# Patient Record
Sex: Male | Born: 2000 | Race: White | Hispanic: No | Marital: Single | Smoking: Never smoker
Health system: Southern US, Community
[De-identification: ages and names within clinical notes are randomized; demographics above are authoritative.]

---
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
## 2020-02-02 ENCOUNTER — Emergency Department
Admission: EM | Admit: 2020-02-02 | Discharge: 2020-02-02 | Disposition: A | Discharge status: Home / Self Care | Payer: BC Managed Care – PPO | Attending: Emergency Medicine | Admitting: Emergency Medicine

## 2020-02-02 ENCOUNTER — Other Ambulatory Visit: Payer: Self-pay

## 2020-02-02 ENCOUNTER — Encounter: Payer: Self-pay | Admitting: Emergency Medicine

## 2020-02-02 ENCOUNTER — Emergency Department: Payer: BC Managed Care – PPO

## 2020-02-02 DIAGNOSIS — R112 Nausea with vomiting, unspecified: Secondary | ICD-10-CM | POA: Diagnosis not present

## 2020-02-02 DIAGNOSIS — K859 Acute pancreatitis without necrosis or infection, unspecified: Secondary | ICD-10-CM | POA: Diagnosis not present

## 2020-02-02 DIAGNOSIS — R1013 Epigastric pain: Secondary | ICD-10-CM | POA: Diagnosis present

## 2020-02-02 LAB — COMPREHENSIVE METABOLIC PANEL
ALT: 38 U/L (ref 0–44)
AST: 24 U/L (ref 15–41)
Albumin: 4.9 g/dL (ref 3.5–5.0)
Alkaline Phosphatase: 98 U/L (ref 38–126)
Anion gap: 11 (ref 5–15)
BUN: 17 mg/dL (ref 6–20)
CO2: 25 mmol/L (ref 22–32)
Calcium: 9.8 mg/dL (ref 8.9–10.3)
Chloride: 101 mmol/L (ref 98–111)
Creatinine, Ser: 0.9 mg/dL (ref 0.61–1.24)
GFR calc Af Amer: >60 mL/min (ref >60)
GFR calc non Af Amer: >60 mL/min (ref >60)
Glucose, Bld: 137 mg/dL — ABNORMAL HIGH (ref 70–99)
Potassium: 4 mmol/L (ref 3.5–5.1)
Sodium: 137 mmol/L (ref 135–145)
Total Bilirubin: 1.5 mg/dL — ABNORMAL HIGH (ref 0.3–1.2)
Total Protein: 8.3 g/dL — ABNORMAL HIGH (ref 6.5–8.1)

## 2020-02-02 LAB — CBC
HCT: 45.3 % (ref 39.0–52.0)
Hemoglobin: 15.3 g/dL (ref 13.0–17.0)
MCH: 27.8 pg (ref 26.0–34.0)
MCHC: 33.8 g/dL (ref 30.0–36.0)
MCV: 82.4 fL (ref 80.0–100.0)
Platelets: 317 10*3/uL (ref 150–400)
RBC: 5.5 MIL/uL (ref 4.22–5.81)
RDW: 12.9 % (ref 11.5–15.5)
WBC: 14.7 10*3/uL — ABNORMAL HIGH (ref 4.0–10.5)
nRBC: 0 % (ref 0.0–0.2)

## 2020-02-02 LAB — URINALYSIS, COMPLETE (UACMP) WITH MICROSCOPIC
Bacteria, UA: NONE SEEN
Bilirubin Urine: NEGATIVE
Glucose, UA: 50 mg/dL — AB
Hgb urine dipstick: NEGATIVE
Ketones, ur: NEGATIVE mg/dL
Leukocytes,Ua: NEGATIVE
Nitrite: NEGATIVE
Protein, ur: 100 mg/dL — AB
Specific Gravity, Urine: 1.036 — ABNORMAL HIGH (ref 1.005–1.030)
pH: 6 (ref 5.0–8.0)

## 2020-02-02 LAB — LIPASE, BLOOD: Lipase: 137 U/L — ABNORMAL HIGH (ref 11–51)

## 2020-02-02 MED ORDER — ONDANSETRON HCL 4 MG/2ML IJ SOLN
4.0000 mg | Freq: Once | INTRAMUSCULAR | Status: AC
  Administered 2020-02-02: 14:00:00 4 mg via INTRAVENOUS
  Filled 2020-02-02: qty 2

## 2020-02-02 MED ORDER — IOHEXOL 300 MG/ML  SOLN
100.0000 mL | Freq: Once | INTRAMUSCULAR | Status: AC | PRN
  Administered 2020-02-02: 15:00:00 100 mL via INTRAVENOUS

## 2020-02-02 MED ORDER — MORPHINE SULFATE (PF) 2 MG/ML IV SOLN
2.0000 mg | Freq: Once | INTRAVENOUS | Status: AC
  Administered 2020-02-02: 14:00:00 2 mg via INTRAVENOUS
  Filled 2020-02-02: qty 1

## 2020-02-02 MED ORDER — ONDANSETRON 4 MG PO TBDP: 4.0000 mg | ORAL_TABLET | Freq: Three times a day (TID) | ORAL | 0 refills | Status: AC | PRN

## 2020-02-02 MED ORDER — SODIUM CHLORIDE 0.9 % IV SOLN
1000.0000 mL | Freq: Once | INTRAVENOUS | Status: AC
  Administered 2020-02-02: 14:00:00 1000 mL via INTRAVENOUS

## 2020-02-02 MED ORDER — HYDROCODONE-ACETAMINOPHEN 5-325 MG PO TABS: 1.0000 | ORAL_TABLET | Freq: Four times a day (QID) | ORAL | 0 refills | Status: AC | PRN

## 2020-02-02 NOTE — ED Provider Notes (Signed)
[facility] Emergency Department Provider Note   ____________________________________________    I have reviewed the triage vital signs and the nursing notes.   HISTORY  Chief Complaint Abdominal Pain     HPI Eric Reyes is a 19 y.o. male who presents with complaints of abdominal pain. Patient reports 2 to 3 days of epigastric abdominal pain, worse with any eating. He reports nausea and vomiting, not tolerating p.o.'s. Normal stools. Never had this before. No history of abdominal surgery. No history of GERD. Has not take anything for this. No fevers or chills. Does not radiate.  History reviewed. No pertinent past medical history.  There are no problems to display for this patient.   History reviewed. No pertinent surgical history.  Prior to Admission medications   Medication Sig Start Date End Date Taking? Authorizing Provider  amoxicillin-clavulanate (AUGMENTIN) 875-125 MG tablet Take 1 tablet by mouth every 12 (twelve) hours. 05/28/16   Renford Dills, NP  HYDROcodone-acetaminophen (NORCO/VICODIN) 5-325 MG tablet Take 1 tablet by mouth every 6 (six) hours as needed for severe pain. 02/02/20   Jene Every, MD  mupirocin ointment (BACTROBAN) 2 % Apply three times a day for 5 days. 05/28/16   Renford Dills, NP  ondansetron (ZOFRAN ODT) 4 MG disintegrating tablet Take 1 tablet (4 mg total) by mouth every 8 (eight) hours as needed. 02/02/20   Jene Every, MD     Allergies Patient has no known allergies.  History reviewed. No pertinent family history.  Social History Social History   Tobacco Use  . Smoking status: Never Smoker  . Smokeless tobacco: Never Used  Substance Use Topics  . Alcohol use: No  . Drug use: Not on file    Review of Systems  Constitutional: No fever/chills Eyes: No visual changes.  ENT: No sore throat. Cardiovascular: Denies chest pain. Respiratory: Denies shortness of breath. Gastrointestinal: As  above Genitourinary: Negative for dysuria. Musculoskeletal: Negative for back pain. Skin: Negative for rash. Neurological: Negative for headaches    ____________________________________________   PHYSICAL EXAM:  VITAL SIGNS: ED Triage Vitals  Enc Vitals Group     BP 02/02/20 1152 123/88     Pulse Rate 02/02/20 1152 82     Resp 02/02/20 1152 16     Temp 02/02/20 1152 99.4 F (37.4 C)     Temp Source 02/02/20 1152 Oral     SpO2 02/02/20 1152 97 %     Weight 02/02/20 1150 71.7 kg (158 lb)     Height 02/02/20 1150 1.702 m (5\' 7" )     Head Circumference --      Peak Flow --      Pain Score 02/02/20 1149 7     Pain Loc --      Pain Edu? --      Excl. in GC? --     Constitutional: Alert and oriented.   Nose: No congestion/rhinnorhea. Mouth/Throat: Mucous membranes are moist.   Neck:  Painless ROM Cardiovascular: Normal rate, regular rhythm. Grossly normal heart sounds.  Good peripheral circulation. Respiratory: Normal respiratory effort.  No retractions. Lungs CTAB. Gastrointestinal: Mild tenderness to the epigastrium . No distention.  No CVA tenderness.  Musculoskeletal:   Warm and well perfused Neurologic:  Normal speech and language. No gross focal neurologic deficits are appreciated.  Skin:  Skin is warm, dry and intact. No rash noted. Psychiatric: Mood and affect are normal. Speech and behavior are normal.  ____________________________________________   LABS (all labs ordered are  listed, but only abnormal results are displayed)  Labs Reviewed  LIPASE, BLOOD - Abnormal; Notable for the following components:      Result Value   Lipase 137 (*)    All other components within normal limits  COMPREHENSIVE METABOLIC PANEL - Abnormal; Notable for the following components:   Glucose, Bld 137 (*)    Total Protein 8.3 (*)    Total Bilirubin 1.5 (*)    All other components within normal limits  CBC - Abnormal; Notable for the following components:   WBC 14.7 (*)     All other components within normal limits  URINALYSIS, COMPLETE (UACMP) WITH MICROSCOPIC - Abnormal; Notable for the following components:   Color, Urine AMBER (*)    APPearance HAZY (*)    Specific Gravity, Urine 1.036 (*)    Glucose, UA 50 (*)    Protein, ur 100 (*)    All other components within normal limits   ____________________________________________  EKG  ED ECG REPORT I, Lavonia Drafts, the attending physician, personally viewed and interpreted this ECG.  Date: 02/02/2020  Rhythm: normal sinus rhythm QRS Axis: normal Intervals: normal ST/T Wave abnormalities: normal Narrative Interpretation: no evidence of acute ischemia  ____________________________________________  RADIOLOGY  CT abdomen pelvis consistent with acute pancreatitis, uncomplicated ____________________________________________   PROCEDURES  Procedure(s) performed: No  Procedures   Critical Care performed: No ____________________________________________   INITIAL IMPRESSION / ASSESSMENT AND PLAN / ED COURSE  Pertinent labs & imaging results that were available during my care of the patient were reviewed by me and considered in my medical decision making (see chart for details).  Patient presents with nausea vomiting epigastric pain.  Differential includes cannabis hyperemesis, gastritis, pancreatitis.  Lab work significant for mildly elevated lipase.  Will send for CT abdomen pelvis.  Treated with IV morphine, IV Zofran  Patient with significant provement after IV morphine and IV Zofran, CT consistent with acute pancreatitis however appears uncomplicated.  He is well-appearing and feels improved.  Appropriate for outpatient management with analgesics, Zofran, liquid diet, progress Gradually.  Return precautions discussed    ____________________________________________   FINAL CLINICAL IMPRESSION(S) / ED DIAGNOSES  Final diagnoses:  Acute pancreatitis, unspecified complication  status, unspecified pancreatitis type        Note:  This document was prepared using Dragon voice recognition software and may include unintentional dictation errors.   Lavonia Drafts, MD 02/02/20 [phone]

## 2020-02-02 NOTE — ED Triage Notes (Signed)
Pt here for vomiting and upper mid abdominal pain. Last vomiting last night.  C/o tight feeling in upper abdomen.  Reports subjective fevers.  No diarrhea.  Ambulatory.  VSS in triage.

## 2020-06-26 IMAGING — CT CT ABD-PELV W/ CM
2 of 4 series · 16 of 46 positions shown, 18 images · IV contrast (APPLIED)
Comparison: None.

CLINICAL DATA: Pancreatitis is effected. Vomiting and upper
abdominal pain.

EXAM:
CT ABDOMEN AND PELVIS WITH CONTRAST
TECHNIQUE: Multidetector CT imaging of the abdomen and pelvis was performed
using the standard protocol following bolus administration of
intravenous contrast.
CONTRAST:  100mL OMNIPAQUE IOHEXOL 300 MG/ML  SOLN

[Series 2: routine abd/pel with · axial · 0.70mm/px · z∈[-485,-55]mm · 13 of 94 slices shown, 15 images]
[im 4/94  soft-tissue]
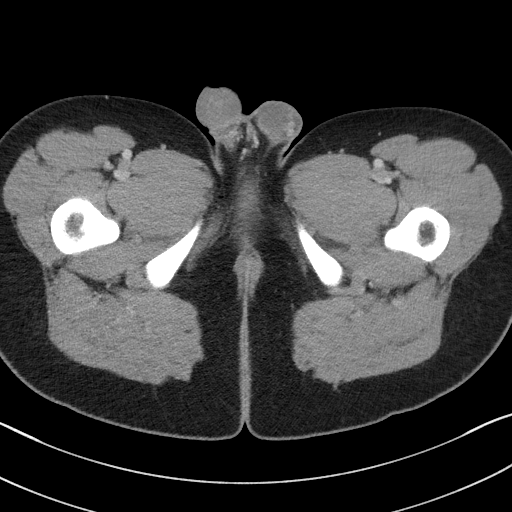
[im 4/94  bone]
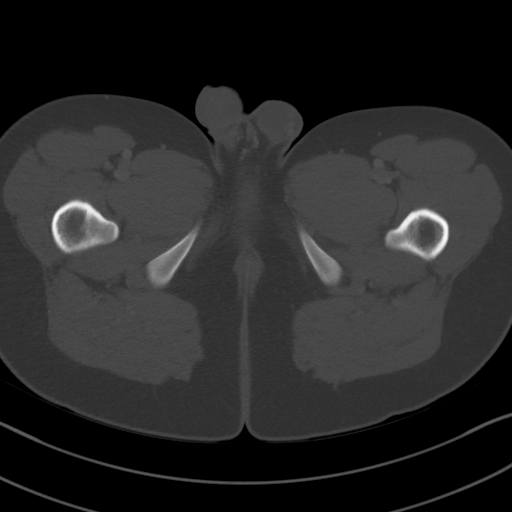
[im 12/94  soft-tissue]
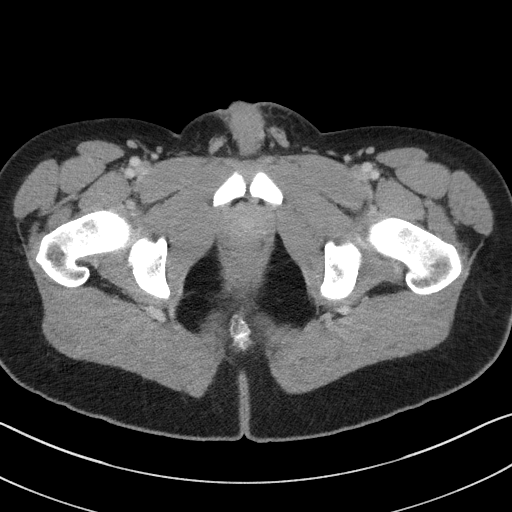
[im 20/94  soft-tissue]
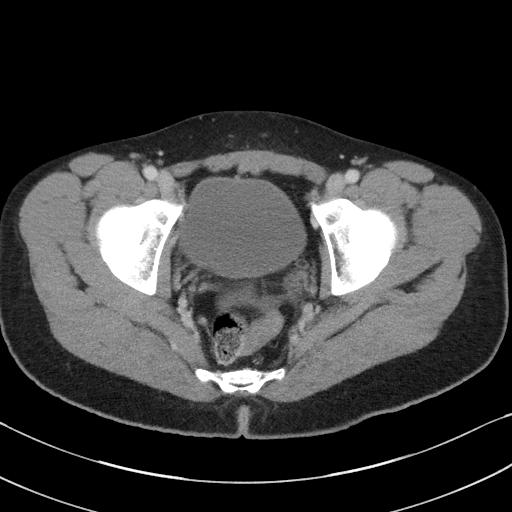
[im 28/94  soft-tissue]
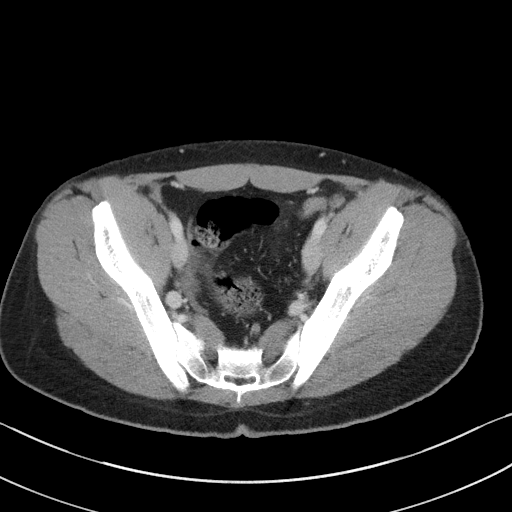
[im 32/94  soft-tissue]
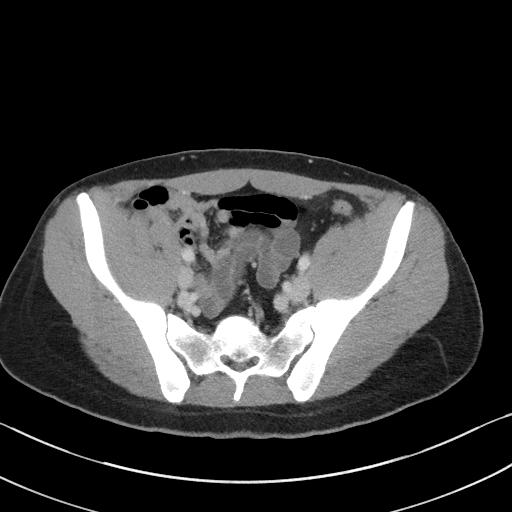
[im 39/94  soft-tissue]
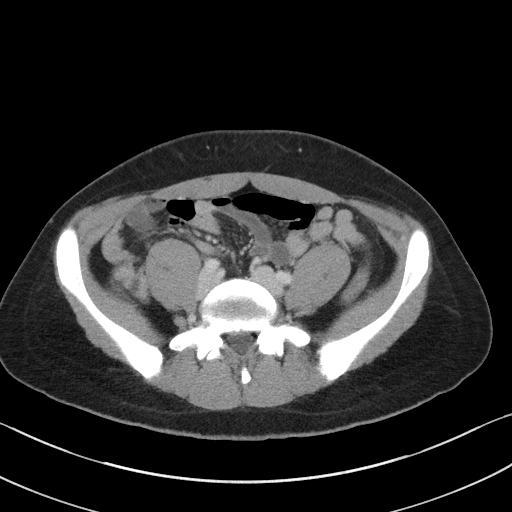
[im 47/94  soft-tissue]
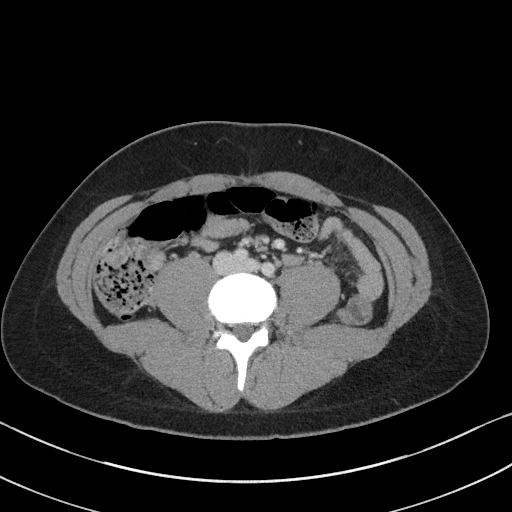
[im 55/94  soft-tissue]
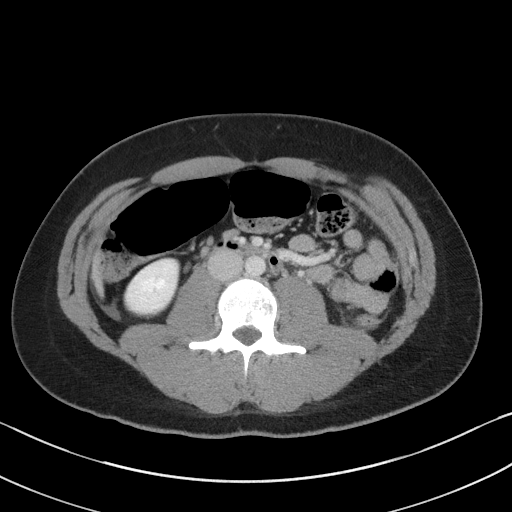
[im 63/94  soft-tissue]
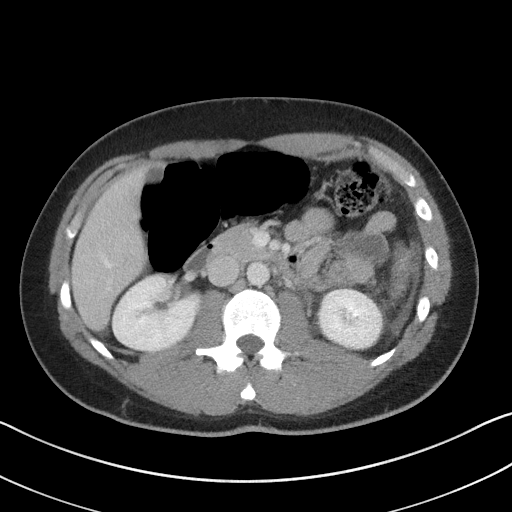
[im 63/94  bone]
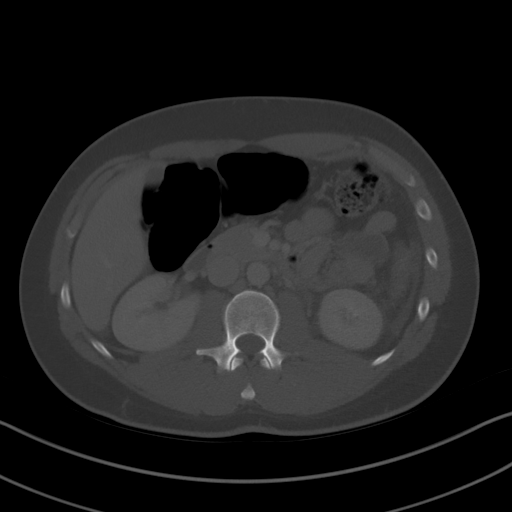
[im 66/94  soft-tissue]
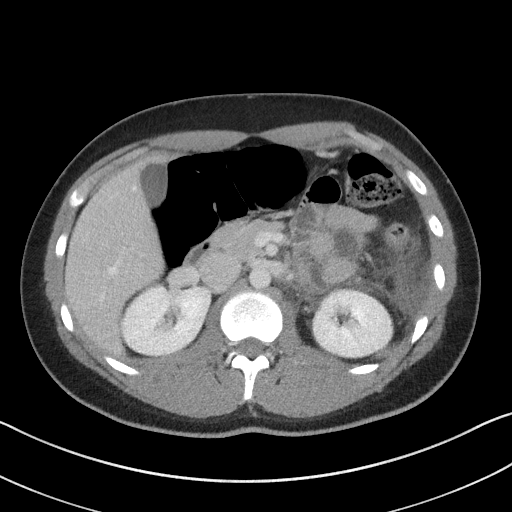
[im 74/94  soft-tissue]
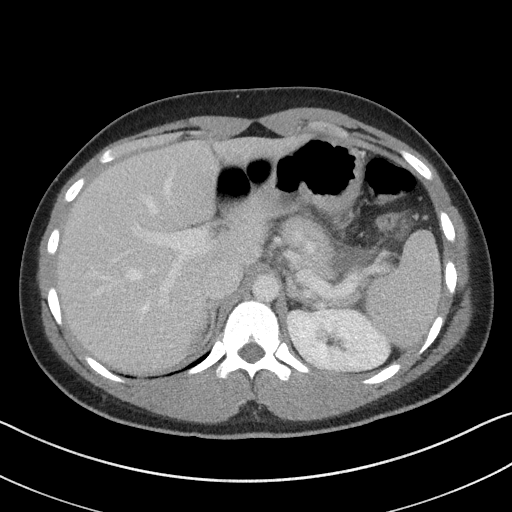
[im 82/94  soft-tissue]
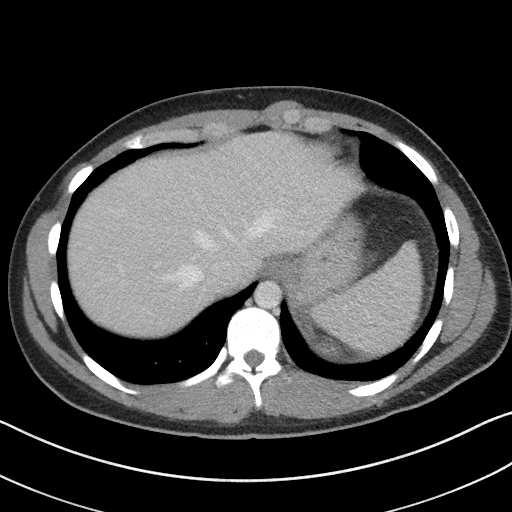
[im 90/94  soft-tissue]
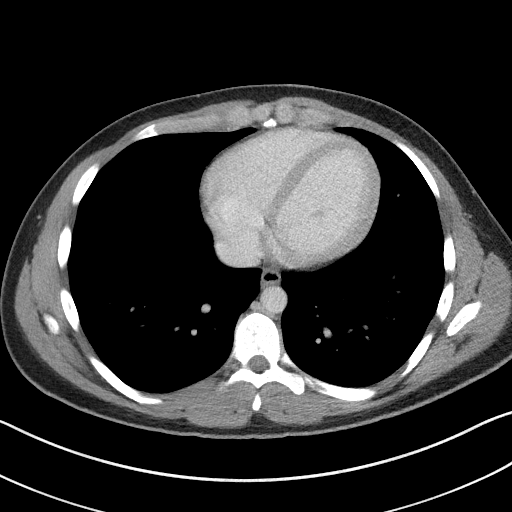

[Series 5: coronal st · coronal · 0.68mm/px · 3 of 82 slices shown]
[im 28/82  soft-tissue]
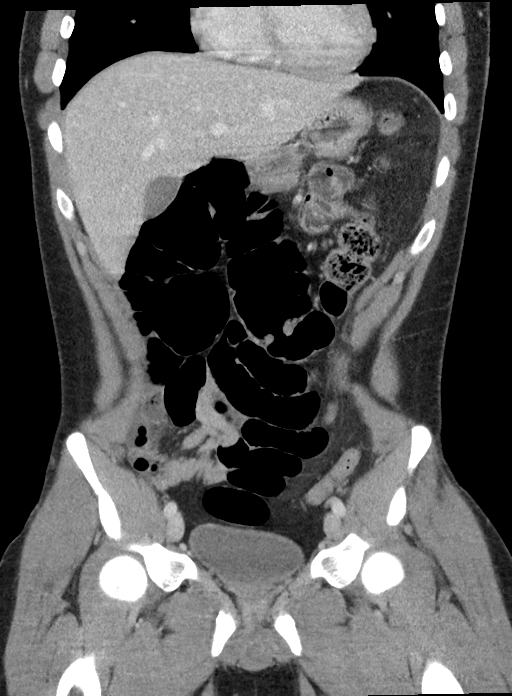
[im 37/82  soft-tissue]
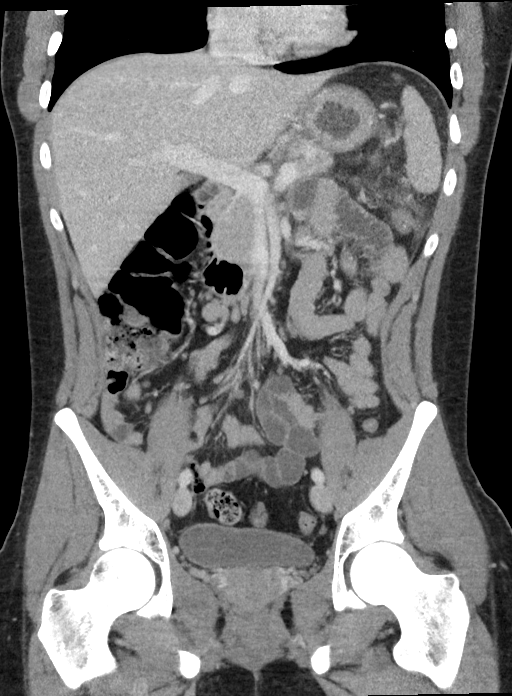
[im 46/82  soft-tissue]
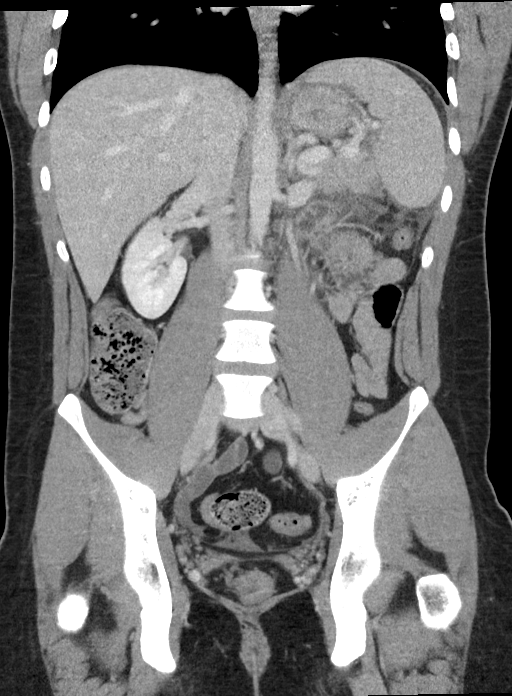

[16 of 46 positions shown; findings below may reference images not displayed]

FINDINGS: Lower chest: No acute abnormality.

Hepatobiliary: No focal liver abnormality is seen. No gallstones,
gallbladder wall thickening, or biliary dilatation.

Pancreas: Fluid/edema surrounding the pancreatic tail consistent
with acute pancreatitis. The fluid extends into the LEFT pericolonic
gutter. No evidence of acute hemorrhage, abscess or pseudocyst
formation.

Spleen: Normal in size without focal abnormality.

Adrenals/Urinary Tract: Adrenal glands appear normal. Kidneys are
unremarkable without mass, stone or hydronephrosis. Bladder is
unremarkable, partially decompressed.

Stomach/Bowel: No dilated large or small bowel loops. No evidence of
bowel wall inflammation. Appendix is normal.

Vascular/Lymphatic: No significant vascular findings are present. No
enlarged abdominal or pelvic lymph nodes.

Reproductive: Prostate is unremarkable.

Other: Additional small amount of free fluid in the pelvis and RIGHT
pericolonic gutter. No free intraperitoneal air.

Musculoskeletal: No acute or significant osseous findings.
IMPRESSION: 1. Findings are consistent with acute pancreatitis. Associated fluid
surrounding the pancreatic tail and extending into the LEFT
pericolonic gutter. No evidence of acute hemorrhage, abscess or
pseudocyst formation.
2. Additional small amount of associated free fluid in the pelvis
and RIGHT pericolonic gutter.
3. Remainder of the abdomen and pelvis CT is unremarkable. No bowel
obstruction. Appendix is normal.

## 2022-11-08 ENCOUNTER — Other Ambulatory Visit: Payer: BC Managed Care – PPO

## 2022-11-16 ENCOUNTER — Ambulatory Visit (LOCAL_COMMUNITY_HEALTH_CENTER): Payer: BC Managed Care – PPO

## 2022-11-16 DIAGNOSIS — Z23 Encounter for immunization: Secondary | ICD-10-CM

## 2022-11-16 DIAGNOSIS — Z719 Counseling, unspecified: Secondary | ICD-10-CM

## 2022-11-16 DIAGNOSIS — Z111 Encounter for screening for respiratory tuberculosis: Secondary | ICD-10-CM

## 2022-11-16 NOTE — Progress Notes (Addendum)
In Nurse [facility] requesting Tb skin test for school and also requested Tdap and flu vaccines.  PPD administered and appt to return 11/19/22 for reading.  VIS given for Tdap and flu vaccines.  Tdap and flu vaccines given; tolerated well.  [facility] updated and 2 copies to pt.    Cherlynn Polo, RN

## 2022-11-19 ENCOUNTER — Ambulatory Visit (LOCAL_COMMUNITY_HEALTH_CENTER): Payer: Self-pay

## 2022-11-19 DIAGNOSIS — Z111 Encounter for screening for respiratory tuberculosis: Secondary | ICD-10-CM

## 2022-11-19 LAB — TB SKIN TEST
Induration: 0 mm
TB Skin Test: NEGATIVE

## 2024-09-13 ENCOUNTER — Ambulatory Visit: Payer: Self-pay
